# Patient Record
Sex: Male | Born: 2000
Health system: Southern US, Community
[De-identification: ages and names within clinical notes are randomized; demographics above are authoritative.]

## PROBLEM LIST (undated history)

## (undated) HISTORY — PX: ARTHOSCOPIC ROTAOR CUFF REPAIR: SHX5002

---
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
## 2020-04-27 ENCOUNTER — Other Ambulatory Visit: Payer: Self-pay

## 2020-04-27 DIAGNOSIS — Z20822 Contact with and (suspected) exposure to covid-19: Secondary | ICD-10-CM

## 2020-04-28 LAB — SARS-COV-2, NAA 2 DAY TAT

## 2020-04-28 LAB — NOVEL CORONAVIRUS, NAA: SARS-CoV-2, NAA: NOT DETECTED

## 2020-05-03 ENCOUNTER — Other Ambulatory Visit: Payer: Self-pay

## 2020-05-03 DIAGNOSIS — Z20822 Contact with and (suspected) exposure to covid-19: Secondary | ICD-10-CM

## 2020-05-04 LAB — NOVEL CORONAVIRUS, NAA: SARS-CoV-2, NAA: NOT DETECTED

## 2020-05-04 LAB — SARS-COV-2, NAA 2 DAY TAT

## 2020-05-09 ENCOUNTER — Other Ambulatory Visit: Payer: Self-pay

## 2020-05-09 DIAGNOSIS — Z20822 Contact with and (suspected) exposure to covid-19: Secondary | ICD-10-CM

## 2020-05-10 LAB — SARS-COV-2, NAA 2 DAY TAT

## 2020-05-10 LAB — NOVEL CORONAVIRUS, NAA: SARS-CoV-2, NAA: NOT DETECTED

## 2020-05-30 ENCOUNTER — Other Ambulatory Visit: Payer: Self-pay

## 2020-05-30 DIAGNOSIS — Z20822 Contact with and (suspected) exposure to covid-19: Secondary | ICD-10-CM

## 2020-05-31 LAB — SARS-COV-2, NAA 2 DAY TAT

## 2020-05-31 LAB — NOVEL CORONAVIRUS, NAA: SARS-CoV-2, NAA: NOT DETECTED

## 2020-06-06 ENCOUNTER — Other Ambulatory Visit: Payer: Self-pay

## 2020-06-06 DIAGNOSIS — Z20822 Contact with and (suspected) exposure to covid-19: Secondary | ICD-10-CM

## 2020-06-07 LAB — NOVEL CORONAVIRUS, NAA: SARS-CoV-2, NAA: NOT DETECTED

## 2020-06-07 LAB — SARS-COV-2, NAA 2 DAY TAT

## 2020-06-13 ENCOUNTER — Other Ambulatory Visit: Payer: Self-pay

## 2020-06-13 DIAGNOSIS — Z20822 Contact with and (suspected) exposure to covid-19: Secondary | ICD-10-CM

## 2020-06-14 LAB — NOVEL CORONAVIRUS, NAA: SARS-CoV-2, NAA: NOT DETECTED

## 2020-06-14 LAB — SARS-COV-2, NAA 2 DAY TAT

## 2020-11-12 ENCOUNTER — Telehealth: Payer: Self-pay

## 2020-11-13 NOTE — Telephone Encounter (Signed)
Mom called wanting immunization history and to make sure the pt is up to date. Pt gave verbal consent on the phone to speak with mother.  Pt needs a booster for [facility] and needs both men b vaccinations. Informed mom that pt can receive the [facility] and first men b on the same day.  Mom would like to schedule nurse visit for this.   Per office protocol we cannot give immunizations in a nurse visit. Pt is required to come in for an office visit with the provider.  Pt has not been seen in the office since 2019. He is home for Christmas Break currently.  Offered appts with Mary-Margaret in Jan but due to pts school and work schedule he could not come. Mom requested Jan 4th before pt goes back to school. Suggested for her to call the office for cancellations. Informed mom that the pts school may also be able to give him the vaccines and they will check on that.

## 2021-05-21 NOTE — Patient Instructions (Addendum)
DUE TO COVID-19 ONLY ONE VISITOR IS ALLOWED TO COME WITH YOU AND STAY IN THE WAITING ROOM ONLY DURING PRE OP AND PROCEDURE DAY OF SURGERY. THE 1 VISITOR  MAY VISIT WITH YOU AFTER SURGERY IN YOUR PRIVATE ROOM DURING VISITING HOURS ONLY!                 Aaron Sutton     Your procedure is scheduled on: 05/23/21   Report to [facility] Main  Entrance   Report to admitting at  8:30 AM     Call this number if you have problems the morning of surgery [phone]    BRUSH YOUR TEETH MORNING OF SURGERY AND RINSE YOUR MOUTH OUT, NO CHEWING GUM CANDY OR MINTS.   No food after midnight.    You may have clear liquid until 7:30 AM.    At 7:00 AM drink pre surgery drink.   Nothing by mouth after 7:30 AM.   CLEAR LIQUID DIET   Foods Allowed                                                                     Foods Excluded  Coffee and tea, regular and decaf                             liquids that you cannot  Plain Jell-O any favor except red or purple                                           see through such as: Fruit ices (not with fruit pulp)                                     milk, soups, orange juice  Iced Popsicles                                    All solid food Carbonated beverages, regular and diet                                    Cranberry, grape and apple juices Sports drinks like Gatorade Lightly seasoned clear broth or consume(fat free) Sugar, honey syrup  Sample Menu Breakfast                                Lunch                                     Supper Cranberry juice                    Beef broth  Chicken broth Jell-O                                     Grape juice                           Apple juice Coffee or tea                        Jell-O                                      Popsicle                                                Coffee or tea                        Coffee or  tea  _____________________________________________________________________    Take these medicines the morning of surgery with A SIP OF WATER: none                                You may not have any metal on your body including              piercings  Do not wear jewelry, lotions, powders or deodorant         .              Men may shave face and neck.   Do not bring valuables to the hospital. [facility] IS NOT             RESPONSIBLE   FOR VALUABLES.  Contacts, dentures or bridgework may not be worn into surgery.       Patients discharged the day of surgery will not be allowed to drive home.   IF YOU ARE HAVING SURGERY AND GOING HOME THE SAME DAY, YOU MUST HAVE AN ADULT TO DRIVE YOU HOME AND BE WITH YOU FOR 24 HOURS.   YOU MAY GO HOME BY TAXI OR UBER OR ORTHERWISE, BUT AN ADULT MUST ACCOMPANY YOU HOME AND STAY WITH YOU FOR 24 HOURS.  Name and phone number of your driver:  Special Instructions: N/A              Please read over the following fact sheets you were given: _____________________________________________________________________             [facility] - Preparing for Surgery Before surgery, you can play an important role.  Because skin is not sterile, your skin needs to be as free of germs as possible.  You can reduce the number of germs on your skin by washing with CHG (chlorahexidine gluconate) soap before surgery.  CHG is an antiseptic cleaner which kills germs and bonds with the skin to continue killing germs even after washing. Please DO NOT use if you have an allergy to CHG or antibacterial soaps.  If your skin becomes reddened/irritated stop using the CHG and inform your nurse when you arrive at Short Stay.  You may shave your face/neck.  Please follow these  instructions carefully:  1.  Shower with CHG Soap the night before surgery and the  morning of Surgery.  2.  If you choose to wash your hair, wash your hair first as usual with your  normal  shampoo.  3.   After you shampoo, rinse your hair and body thoroughly to remove the  shampoo.                                        4.  Use CHG as you would any other liquid soap.  You can apply chg directly  to the skin and wash                       Gently with a scrungie or clean washcloth.  5.  Apply the CHG Soap to your body ONLY FROM THE NECK DOWN.   Do not use on face/ open                           Wound or open sores. Avoid contact with eyes, ears mouth and genitals (private parts).                       Wash face,  Genitals (private parts) with your normal soap.             6.  Wash thoroughly, paying special attention to the area where your surgery  will be performed.  7.  Thoroughly rinse your body with warm water from the neck down.  8.  DO NOT shower/wash with your normal soap after using and rinsing off  the CHG Soap.             9.  Pat yourself dry with a clean towel.            10.  Wear clean pajamas.            11.  Place clean sheets on your bed the night of your first shower and do not  sleep with pets. Day of Surgery : Do not apply any lotions/deodorants the morning of surgery.  Please wear clean clothes to the hospital/surgery center.  FAILURE TO FOLLOW THESE INSTRUCTIONS MAY RESULT IN THE CANCELLATION OF YOUR SURGERY PATIENT SIGNATURE_________________________________  NURSE SIGNATURE__________________________________  ________________________________________________________________________   [name]  An incentive spirometer is a tool that can help keep your lungs clear and active. This tool measures how well you are filling your lungs with each breath. Taking long deep breaths may help reverse or decrease the chance of developing breathing (pulmonary) problems (especially infection) following: A long period of time when you are unable to move or be active. BEFORE THE PROCEDURE  If the spirometer includes an indicator to show your best effort, your nurse or  respiratory therapist will set it to a desired goal. If possible, sit up straight or lean slightly forward. Try not to slouch. Hold the incentive spirometer in an upright position. INSTRUCTIONS FOR USE  Sit on the edge of your bed if possible, or sit up as far as you can in bed or on a chair. Hold the incentive spirometer in an upright position. Breathe out normally. Place the mouthpiece in your mouth and seal your lips tightly around it. Breathe in slowly and as deeply as possible, raising the piston or the ball toward  the top of the column. Hold your breath for 3-5 seconds or for as long as possible. Allow the piston or ball to fall to the bottom of the column. Remove the mouthpiece from your mouth and breathe out normally. Rest for a few seconds and repeat Steps 1 through 7 at least 10 times every 1-2 hours when you are awake. Take your time and take a few normal breaths between deep breaths. The spirometer may include an indicator to show your best effort. Use the indicator as a goal to work toward during each repetition. After each set of 10 deep breaths, practice coughing to be sure your lungs are clear. If you have an incision (the cut made at the time of surgery), support your incision when coughing by placing a pillow or rolled up towels firmly against it. Once you are able to get out of bed, walk around indoors and cough well. You may stop using the incentive spirometer when instructed by your caregiver.  RISKS AND COMPLICATIONS Take your time so you do not get dizzy or light-headed. If you are in pain, you may need to take or ask for pain medication before doing incentive spirometry. It is harder to take a deep breath if you are having pain. AFTER USE Rest and breathe slowly and easily. It can be helpful to keep track of a log of your progress. Your caregiver can provide you with a simple table to help with this. If you are using the spirometer at home, follow these  instructions: SEEK MEDICAL CARE IF:  You are having difficultly using the spirometer. You have trouble using the spirometer as often as instructed. Your pain medication is not giving enough relief while using the spirometer. You develop fever of 100.5 F (38.1 C) or higher. SEEK IMMEDIATE MEDICAL CARE IF:  You cough up bloody sputum that had not been present before. You develop fever of 102 F (38.9 C) or greater. You develop worsening pain at or near the incision site. MAKE SURE YOU:  Understand these instructions. Will watch your condition. Will get help right away if you are not doing well or get worse. Document Released: 03/22/2007 Document Revised: 02/01/2012 Document Reviewed: 05/23/2007 [facility] Patient Information 2014 [employer] [place].   ________________________________________________________________________

## 2021-05-22 ENCOUNTER — Encounter (HOSPITAL_COMMUNITY): Payer: Self-pay

## 2021-05-22 ENCOUNTER — Other Ambulatory Visit: Payer: Self-pay

## 2021-05-22 ENCOUNTER — Encounter (HOSPITAL_COMMUNITY)
Admission: RE | Admit: 2021-05-22 | Discharge: 2021-05-22 | Disposition: A | Discharge status: Home / Self Care | Payer: 59 | Source: Ambulatory Visit | Attending: Orthopedic Surgery | Admitting: Orthopedic Surgery

## 2021-05-22 DIAGNOSIS — Z01812 Encounter for preprocedural laboratory examination: Secondary | ICD-10-CM | POA: Diagnosis present

## 2021-05-22 LAB — CBC
HCT: 38.2 % — ABNORMAL LOW (ref 39.0–52.0)
Hemoglobin: 12.8 g/dL — ABNORMAL LOW (ref 13.0–17.0)
MCH: 28.3 pg (ref 26.0–34.0)
MCHC: 33.5 g/dL (ref 30.0–36.0)
MCV: 84.5 fL (ref 80.0–100.0)
Platelets: 258 10*3/uL (ref 150–400)
RBC: 4.52 MIL/uL (ref 4.22–5.81)
RDW: 12.4 % (ref 11.5–15.5)
WBC: 5 10*3/uL (ref 4.0–10.5)
nRBC: 0 % (ref 0.0–0.2)

## 2021-05-22 NOTE — Progress Notes (Signed)
COVID Vaccine Completed: Yes Date COVID Vaccine completed: 04/2020 COVID vaccine manufacturer:    Moderna     PCP - Bennie Pierini: NP Cardiologist -   Chest x-ray -  EKG -  Stress Test -  ECHO -  Cardiac Cath -  Pacemaker/ICD device last checked:  Sleep Study -  CPAP -   Fasting Blood Sugar -  Checks Blood Sugar _____ times a day  Blood Thinner Instructions: Aspirin Instructions: Last Dose:  Anesthesia review:   Patient denies shortness of breath, fever, cough and chest pain at PAT appointment   Patient verbalized understanding of instructions that were given to them at the PAT appointment. Patient was also instructed that they will need to review over the PAT instructions again at home before surgery.

## 2021-05-23 ENCOUNTER — Ambulatory Visit (HOSPITAL_COMMUNITY): Payer: 59 | Admitting: Anesthesiology

## 2021-05-23 ENCOUNTER — Ambulatory Visit (HOSPITAL_COMMUNITY)
Admission: RE | Admit: 2021-05-23 | Discharge: 2021-05-23 | Disposition: A | Discharge status: Home / Self Care | Payer: 59 | Attending: Orthopedic Surgery | Admitting: Orthopedic Surgery

## 2021-05-23 ENCOUNTER — Encounter (HOSPITAL_COMMUNITY)
Admission: RE | Disposition: A | Discharge status: Home / Self Care | Payer: Self-pay | Source: Home / Self Care | Attending: Orthopedic Surgery

## 2021-05-23 ENCOUNTER — Ambulatory Visit (HOSPITAL_COMMUNITY): Payer: 59 | Admitting: Physician Assistant

## 2021-05-23 DIAGNOSIS — M25312 Other instability, left shoulder: Secondary | ICD-10-CM | POA: Insufficient documentation

## 2021-05-23 DIAGNOSIS — F17210 Nicotine dependence, cigarettes, uncomplicated: Secondary | ICD-10-CM | POA: Diagnosis not present

## 2021-05-23 HISTORY — PX: BANKART REPAIR: SHX5173

## 2021-05-23 SURGERY — REPAIR, SHOULDER, ARTHROSCOPIC, BANKART
Anesthesia: General | Site: Shoulder | Laterality: Left

## 2021-05-23 MED ORDER — MIDAZOLAM HCL 2 MG/2ML IJ SOLN
INTRAMUSCULAR | Status: AC
  Filled 2021-05-23: qty 2

## 2021-05-23 MED ORDER — FENTANYL CITRATE (PF) 100 MCG/2ML IJ SOLN: 25.0000 ug | INTRAMUSCULAR | Status: DC | PRN

## 2021-05-23 MED ORDER — LACTATED RINGERS IV SOLN: INTRAVENOUS | Status: DC

## 2021-05-23 MED ORDER — LIDOCAINE 2% (20 MG/ML) 5 ML SYRINGE
INTRAMUSCULAR | Status: DC | PRN
  Administered 2021-05-23: 20 mg via INTRAVENOUS

## 2021-05-23 MED ORDER — BUPIVACAINE LIPOSOME 1.3 % IJ SUSP
INTRAMUSCULAR | Status: DC | PRN
  Administered 2021-05-23: 10 mL via PERINEURAL

## 2021-05-23 MED ORDER — OXYCODONE HCL 5 MG/5ML PO SOLN: 5.0000 mg | Freq: Once | ORAL | Status: DC | PRN

## 2021-05-23 MED ORDER — CYCLOBENZAPRINE HCL 10 MG PO TABS: 10.0000 mg | ORAL_TABLET | Freq: Three times a day (TID) | ORAL | 1 refills | Status: AC | PRN

## 2021-05-23 MED ORDER — ACETAMINOPHEN 325 MG PO TABS: 325.0000 mg | ORAL_TABLET | ORAL | Status: DC | PRN

## 2021-05-23 MED ORDER — BUPIVACAINE-EPINEPHRINE (PF) 0.5% -1:200000 IJ SOLN
INTRAMUSCULAR | Status: DC | PRN
  Administered 2021-05-23: 10 mL via PERINEURAL

## 2021-05-23 MED ORDER — MEPERIDINE HCL 50 MG/ML IJ SOLN: 6.2500 mg | INTRAMUSCULAR | Status: DC | PRN

## 2021-05-23 MED ORDER — ONDANSETRON HCL 4 MG PO TABS: 4.0000 mg | ORAL_TABLET | Freq: Three times a day (TID) | ORAL | 0 refills | Status: AC | PRN

## 2021-05-23 MED ORDER — PROPOFOL 10 MG/ML IV BOLUS
INTRAVENOUS | Status: DC | PRN
  Administered 2021-05-23: 200 mg via INTRAVENOUS
  Administered 2021-05-23: 100 mg via INTRAVENOUS

## 2021-05-23 MED ORDER — PROPOFOL 10 MG/ML IV BOLUS
INTRAVENOUS | Status: AC
  Filled 2021-05-23: qty 20

## 2021-05-23 MED ORDER — SODIUM CHLORIDE 0.9 % IR SOLN
Status: DC | PRN
  Administered 2021-05-23 (×2): 6000 mL

## 2021-05-23 MED ORDER — CHLORHEXIDINE GLUCONATE 0.12 % MT SOLN
15.0000 mL | Freq: Once | OROMUCOSAL | Status: AC
Start: 2021-05-23 — End: 2021-05-23

## 2021-05-23 MED ORDER — ACETAMINOPHEN 160 MG/5ML PO SOLN: 325.0000 mg | ORAL | Status: DC | PRN

## 2021-05-23 MED ORDER — ROCURONIUM BROMIDE 10 MG/ML (PF) SYRINGE
PREFILLED_SYRINGE | INTRAVENOUS | Status: DC | PRN
  Administered 2021-05-23: 60 mg via INTRAVENOUS

## 2021-05-23 MED ORDER — ONDANSETRON HCL 4 MG/2ML IJ SOLN
INTRAMUSCULAR | Status: DC | PRN
  Administered 2021-05-23: 4 mg via INTRAVENOUS

## 2021-05-23 MED ORDER — DEXMEDETOMIDINE (PRECEDEX) IN NS 20 MCG/5ML (4 MCG/ML) IV SYRINGE
PREFILLED_SYRINGE | INTRAVENOUS | Status: DC | PRN
  Administered 2021-05-23 (×2): 10 ug via INTRAVENOUS

## 2021-05-23 MED ORDER — DEXAMETHASONE SODIUM PHOSPHATE 10 MG/ML IJ SOLN
INTRAMUSCULAR | Status: DC | PRN
  Administered 2021-05-23: 10 mg via INTRAVENOUS

## 2021-05-23 MED ORDER — NAPROXEN 500 MG PO TABS: 500.0000 mg | ORAL_TABLET | Freq: Two times a day (BID) | ORAL | 1 refills | Status: AC

## 2021-05-23 MED ORDER — PHENYLEPHRINE 40 MCG/ML (10ML) SYRINGE FOR IV PUSH (FOR BLOOD PRESSURE SUPPORT)
PREFILLED_SYRINGE | INTRAVENOUS | Status: DC | PRN
  Administered 2021-05-23: 120 ug via INTRAVENOUS

## 2021-05-23 MED ORDER — OXYCODONE-ACETAMINOPHEN 5-325 MG PO TABS: 1.0000 | ORAL_TABLET | ORAL | 0 refills | Status: AC | PRN

## 2021-05-23 MED ORDER — SUGAMMADEX SODIUM 200 MG/2ML IV SOLN
INTRAVENOUS | Status: DC | PRN
  Administered 2021-05-23: 200 mg via INTRAVENOUS

## 2021-05-23 MED ORDER — FENTANYL CITRATE (PF) 250 MCG/5ML IJ SOLN
INTRAMUSCULAR | Status: AC
  Filled 2021-05-23: qty 5

## 2021-05-23 MED ORDER — CEFAZOLIN SODIUM-DEXTROSE 2-4 GM/100ML-% IV SOLN
2.0000 g | INTRAVENOUS | Status: AC
Start: 2021-05-23 — End: 2021-05-23
  Administered 2021-05-23: 2 g via INTRAVENOUS
  Filled 2021-05-23: qty 100

## 2021-05-23 MED ORDER — OXYCODONE HCL 5 MG PO TABS
5.0000 mg | ORAL_TABLET | Freq: Once | ORAL | Status: DC | PRN
Start: 2021-05-23 — End: 2021-05-23

## 2021-05-23 MED ORDER — FENTANYL CITRATE (PF) 100 MCG/2ML IJ SOLN
INTRAMUSCULAR | Status: DC | PRN
  Administered 2021-05-23: 25 ug via INTRAVENOUS
  Administered 2021-05-23: 75 ug via INTRAVENOUS
  Administered 2021-05-23: 50 ug via INTRAVENOUS

## 2021-05-23 MED ORDER — FENTANYL CITRATE (PF) 100 MCG/2ML IJ SOLN
50.0000 ug | INTRAMUSCULAR | Status: DC
  Administered 2021-05-23: 100 ug via INTRAVENOUS
  Filled 2021-05-23: qty 2

## 2021-05-23 MED ORDER — MIDAZOLAM HCL 5 MG/5ML IJ SOLN
INTRAMUSCULAR | Status: DC | PRN
  Administered 2021-05-23: 2 mg via INTRAVENOUS

## 2021-05-23 MED ORDER — ONDANSETRON HCL 4 MG/2ML IJ SOLN: 4.0000 mg | Freq: Once | INTRAMUSCULAR | Status: DC | PRN

## 2021-05-23 MED ORDER — MIDAZOLAM HCL 2 MG/2ML IJ SOLN
1.0000 mg | INTRAMUSCULAR | Status: DC
  Administered 2021-05-23: 2 mg via INTRAVENOUS
  Filled 2021-05-23: qty 2

## 2021-05-23 MED ORDER — ORAL CARE MOUTH RINSE
15.0000 mL | Freq: Once | OROMUCOSAL | Status: AC
  Administered 2021-05-23: 15 mL via OROMUCOSAL

## 2021-05-23 SURGICAL SUPPLY — 59 items
ANCHOR SUT 1.8 FBRTK KNTLS 2SU (Anchor) ×8 IMPLANT
ANCHOR SUT FBRTK 2 WIRE (Anchor) ×8 IMPLANT
BIT DRILL FLEX 1.7 SHR FBRTK (DRILL) ×1 IMPLANT
BLADE SURG 11 STRL SS (BLADE) ×2 IMPLANT
BOOTCOVER CLEANROOM LRG (PROTECTIVE WEAR) ×4 IMPLANT
CANISTER SUCT LVC 12 LTR MEDI- (MISCELLANEOUS) ×2 IMPLANT
CANNULA ACUFLEX KIT 5X76 (CANNULA) ×4 IMPLANT
CANNULA DRILOCK 5.0X75 (CANNULA) ×10 IMPLANT
CANNULA TWIST IN 8.25X7CM (CANNULA) ×2 IMPLANT
COVER WAND RF STERILE (DRAPES) ×2 IMPLANT
DISSECTOR  3.8MM X 13CM (MISCELLANEOUS) ×2
DISSECTOR 3.8MM X 13CM (MISCELLANEOUS) ×2 IMPLANT
DRAPE INCISE IOBAN 66X45 STRL (DRAPES) ×2 IMPLANT
DRAPE ORTHO SPLIT 77X108 STRL (DRAPES) ×2
DRAPE STERI 35X30 U-POUCH (DRAPES) IMPLANT
DRAPE SURG 17X11 SM STRL (DRAPES) ×2 IMPLANT
DRAPE SURG ORHT 6 SPLT 77X108 (DRAPES) ×2 IMPLANT
DRAPE U-SHAPE 47X51 STRL (DRAPES) IMPLANT
DRILL FLEX 1.7 SHARP OBT FBRTK (DRILL) ×2
DRSG PAD ABDOMINAL 8X10 ST (GAUZE/BANDAGES/DRESSINGS) ×2 IMPLANT
DURAPREP 26ML APPLICATOR (WOUND CARE) ×4 IMPLANT
GAUZE SPONGE 4X4 12PLY STRL (GAUZE/BANDAGES/DRESSINGS) ×2 IMPLANT
GLOVE SURG ENC MOIS LTX SZ7.5 (GLOVE) ×2 IMPLANT
GLOVE SURG ENC MOIS LTX SZ8 (GLOVE) ×2 IMPLANT
GLOVE SURG MICRO LTX SZ7.5 (GLOVE) ×2 IMPLANT
GOWN STRL REUS W/ TWL LRG LVL3 (GOWN DISPOSABLE) ×1 IMPLANT
GOWN STRL REUS W/ TWL XL LVL3 (GOWN DISPOSABLE) ×2 IMPLANT
GOWN STRL REUS W/TWL LRG LVL3 (GOWN DISPOSABLE) ×1
GOWN STRL REUS W/TWL XL LVL3 (GOWN DISPOSABLE) ×2
KIT BASIN OR (CUSTOM PROCEDURE TRAY) ×2 IMPLANT
KIT CVD SPEAR FBRTK 1.8 DRILL (KITS) ×2 IMPLANT
KIT STR SPEAR 1.8 FBRTK DISP (KITS) ×2 IMPLANT
KIT TURNOVER KIT B (KITS) ×2 IMPLANT
LASSO 45 DEG CVD LEFT (SUTURE) ×2 IMPLANT
MANIFOLD NEPTUNE II (INSTRUMENTS) ×2 IMPLANT
NDL SUT 6 .5 CRC .975X.05 MAYO (NEEDLE) IMPLANT
NEEDLE MAYO TAPER (NEEDLE)
NEEDLE SPNL 18GX3.5 QUINCKE PK (NEEDLE) ×2 IMPLANT
NS IRRIG 1000ML POUR BTL (IV SOLUTION) ×2 IMPLANT
PACK SHOULDER (CUSTOM PROCEDURE TRAY) ×2 IMPLANT
PAD ARMBOARD 7.5X6 YLW CONV (MISCELLANEOUS) ×4 IMPLANT
SLEEVE ARM SUSPENSION SYSTEM (MISCELLANEOUS) ×2 IMPLANT
SLING ARM FOAM STRAP LRG (SOFTGOODS) IMPLANT
SLING ARM FOAM STRAP MED (SOFTGOODS) IMPLANT
SLING ARM IMMOBILIZER LRG (SOFTGOODS) ×2 IMPLANT
SLING S3 LATERAL DISP (MISCELLANEOUS) ×2 IMPLANT
SPONGE LAP 4X18 RFD (DISPOSABLE) IMPLANT
STRIP CLOSURE SKIN 1/2X4 (GAUZE/BANDAGES/DRESSINGS) ×2 IMPLANT
SUT LASSO 45 DEG R (SUTURE) IMPLANT
SUT MNCRL AB 3-0 PS2 18 (SUTURE) ×2 IMPLANT
SUT MNCRL AB 4-0 PS2 18 (SUTURE) IMPLANT
SUT PDS AB 1 CT  36 (SUTURE) ×3
SUT PDS AB 1 CT 36 (SUTURE) ×3 IMPLANT
SYR 20ML LL LF (SYRINGE) IMPLANT
TAPE PAPER 3X10 WHT MICROPORE (GAUZE/BANDAGES/DRESSINGS) ×2 IMPLANT
TOWEL OR 17X26 10 PK STRL BLUE (TOWEL DISPOSABLE) ×2 IMPLANT
TOWEL OR NON WOVEN STRL DISP B (DISPOSABLE) ×2 IMPLANT
TUBING ARTHROSCOPY IRRIG 16FT (MISCELLANEOUS) ×2 IMPLANT
WATER STERILE IRR 1000ML POUR (IV SOLUTION) ×2 IMPLANT

## 2021-05-23 NOTE — Anesthesia Procedure Notes (Signed)
Anesthesia Regional Block: Interscalene brachial plexus block   Pre-Anesthetic Checklist: , timeout performed,  Correct Patient, Correct Site, Correct Laterality,  Correct Procedure, Correct Position, site marked,  Risks and benefits discussed,  Surgical consent,  Pre-op evaluation,  At surgeon's request and post-op pain management  Laterality: Left  Prep: chloraprep       Needles:  Injection technique: Single-shot  Needle Type: Echogenic Stimulator Needle     Needle Length: 5cm  Needle Gauge: 22     Additional Needles:   Procedures:, nerve stimulator,,, ultrasound used (permanent image in chart),,     Nerve Stimulator or Paresthesia:  Response: hand, 0.45 mA  Additional Responses:   Narrative:  Start time: 05/23/2021 9:26 AM End time: 05/23/2021 9:32 AM Injection made incrementally with aspirations every 5 mL.  Performed by: Personally  Anesthesiologist: Bethena Midget, MD  Additional Notes: Functioning IV was confirmed and monitors were applied.  A 24mm 22ga Arrow echogenic stimulator needle was used. Sterile prep and drape,hand hygiene and sterile gloves were used. Ultrasound guidance: relevant anatomy identified, needle position confirmed, local anesthetic spread visualized around nerve(s)., vascular puncture avoided.  Image printed for medical record. Negative aspiration and negative test dose prior to incremental administration of local anesthetic. The patient tolerated the procedure well.

## 2021-05-23 NOTE — Discharge Instructions (Addendum)
   Vania Rea. Supple, M.D., F.A.A.O.S. Orthopaedic Surgery Specializing in Arthroscopic and Reconstructive Surgery of the Shoulder [phone] [address] [place], [place] [address] - Fax [phone]  POST-OP SHOULDER ARTHROSCOPIC ROTATOR CUFF AND/OR LABRAL REPAIR INSTRUCTIONS  1. Call the office at [phone] to schedule your first post-op appointment 7-10 days from the date of your surgery.  2. Leave the steri-strips in place over your incisions when performing dressing changes and showering. You may remove your dressings and begin showering 72 hours from surgery. You can expect drainage that is clear to bloody in nature that occasionally will soak through your dressings. If this occurs go ahead and perform a dressing change. The drainage should lessen daily and when there is no drainage from your incisions feel free to go without a dressing.  3. Wear your sling/immobilizer at all times except  to occasionally let your arm dangle by your side to stretch your elbow. You also need to sleep in your sling immobilizer until instructed otherwise.  4. Range of motion to your elbow, wrist, and hand are encouraged 3-5 times daily. Exercise to your hand and fingers helps to reduce swelling you may experience.  5. Utilize ice to the shoulder 3-4 times minimum a day and additionally if you are experiencing pain.  6. You may one-armed drive when safely off of narcotics and muscle relaxants. You may use your hand that is in the sling to support the steering wheel only. However, should it be your right arm that is in the sling it is not to be used for gear shifting in a manual transmission.  7. Pain control following an exparel block  To help control your post-operative pain you received a nerve block  performed with Exparel which is a long acting anesthetic (numbing agent) which can provide pain relief and sensations of numbness (and relief of pain) in the operative shoulder and arm for  up to 3 days. Sometimes it provides mixed relief, meaning you may still have numbness in certain areas of the arm but can still be able to move  parts of that arm, hand, and fingers. We recommend that your prescribed pain medications  be used as needed. We do not feel it is necessary to "pre medicate" and "stay ahead" of pain.  Taking narcotic pain medications when you are not having any pain can lead to unnecessary and potentially dangerous side effects.    8. Pain medications can produce constipation along with their use. If you experience this, the use of an over the counter stool softener or laxative daily is recommended.   9. For additional questions or concerns, please do not hesitate to call the office. If after hours there is an answering service to forward your concerns to the physician on call.   Use the ice machine as much as possible in the first 5-7 days from surgery, then you can wean its use to as needed.    POST-OP EXERCISES   NO EXERCISES TO SHOULDER, MAY ALLOW ARM TO DANGLE BY SIDE FOR HYGIENE AND MOVE ELBOW WRIST AND HAND.

## 2021-05-23 NOTE — Progress Notes (Signed)
Assisted Dr. Oddono with left, ultrasound guided, interscalene  block. Side rails up, monitors on throughout procedure. See vital signs in flow sheet. Tolerated Procedure well. 

## 2021-05-23 NOTE — Anesthesia Procedure Notes (Signed)
Procedure Name: Intubation Date/Time: 05/23/2021 10:34 AM Performed by: Cleda Daub, CRNA Pre-anesthesia Checklist: Patient identified, Emergency Drugs available, Suction available and Patient being monitored Patient Re-evaluated:Patient Re-evaluated prior to induction Oxygen Delivery Method: Circle system utilized Preoxygenation: Pre-oxygenation with 100% oxygen Induction Type: IV induction Ventilation: Mask ventilation without difficulty Laryngoscope Size: Mac and 4 Grade View: Grade I Tube type: Oral Tube size: 7.5 mm Number of attempts: 1 Airway Equipment and Method: Stylet and Oral airway Placement Confirmation: ETT inserted through vocal cords under direct vision, positive ETCO2 and breath sounds checked- equal and bilateral Secured at: 23 cm Tube secured with: Tape Dental Injury: Teeth and Oropharynx as per pre-operative assessment

## 2021-05-23 NOTE — Anesthesia Postprocedure Evaluation (Signed)
Anesthesia Post Note  Patient: Aaron Sutton  Procedure(s) Performed: Left shoulder arthroscopy, Bankart repair, remplissage (Left: Hip)     Patient location during evaluation: PACU Anesthesia Type: General Level of consciousness: awake and alert Pain management: pain level controlled Vital Signs Assessment: post-procedure vital signs reviewed and stable Respiratory status: spontaneous breathing, nonlabored ventilation, respiratory function stable and patient connected to nasal cannula oxygen Cardiovascular status: blood pressure returned to baseline and stable Postop Assessment: no apparent nausea or vomiting Anesthetic complications: no   No notable events documented.  Last Vitals:  Vitals:   05/23/21 1345 05/23/21 1407  BP: 115/68 117/71  Pulse: 74 60  Resp: 12 16  Temp: (!) 36.4 C 36.5 C  SpO2: 100% 99%    Last Pain:  Vitals:   05/23/21 1407  TempSrc:   PainSc: 0-No pain                 Damian Hofstra

## 2021-05-23 NOTE — Transfer of Care (Signed)
Immediate Anesthesia Transfer of Care Note  Patient: Aaron Sutton  Procedure(s) Performed: Left shoulder arthroscopy, Bankart repair, remplissage (Left: Hip)  Patient Location: PACU  Anesthesia Type:GA combined with regional for post-op pain  Level of Consciousness: awake, alert , oriented and patient cooperative  Airway & Oxygen Therapy: Patient Spontanous Breathing and Patient connected to face mask oxygen  Post-op Assessment: Report given to RN and Post -op Vital signs reviewed and stable  Post vital signs: Reviewed and stable  Last Vitals:  Vitals Value Taken Time  BP 112/71 05/23/21 1300  Temp    Pulse 73 05/23/21 1305  Resp 17 05/23/21 1305  SpO2 98 % 05/23/21 1305  Vitals shown include unvalidated device data.  Last Pain:  Vitals:   05/23/21 0840  TempSrc: Oral         Complications: No notable events documented.

## 2021-05-23 NOTE — Op Note (Signed)
05/23/2021  12:44 PM  PATIENT:   Aaron Sutton  20 y.o. male  PRE-OPERATIVE DIAGNOSIS:  Left shoulder recurrent anterior instability  POST-OPERATIVE DIAGNOSIS: Same  PROCEDURE:  1.  Left shoulder examination under anesthesia  2.  Left shoulder arthroscopic Bankart repair  3.  Left shoulder arthroscopic remplissage  SURGEON:  Brason Berthelot, Vania Rea. M.D.  ASSISTANTS: Ralene Bathe, PA-C  ANESTHESIA:   General endotracheal and interscalene block with Exparel  EBL: Minimal  SPECIMEN: None  Drains: None   PATIENT DISPOSITION:  PACU - hemodynamically stable.    PLAN OF CARE: Discharge to home after PACU  Brief history:  Aaron Sutton is a 20 year old male with a long history of recurrent left shoulder anterior instability.  His instability episodes are now happening multiple times a week.  His plain radiographs as well as MRI scan confirms significant bipolar bone loss and changes consistent with chronic anterior instability.  Due to his increasing functional imitations he is brought to the operating this time for planned arthroscopic stabilization as described below.  Preoperatively counseled Aaron Sutton regarding treatment options as well as the potential risks versus benefits thereof.  Possible surgical complications were reviewed including bleeding, infection, neurovascular injury, persistence of pain, loss of motion, anesthetic complication, recurrence of instability, and possible need for additional surgery.  He understands, and accepts, and agrees with the plan procedure.  Procedure in detail:  After undergoing routine preop evaluation patient received prophylactic antibiotics and interscalene block with Exparel was established in the holding area by the anesthesia department.  Patient was subsequently placed supine on the operating table underwent the smooth induction of general endotracheal anesthesia.  Turned to the right lateral decubitus position on a beanbag and appropriately padded and  protected.  Left shoulder examination under anesthesia revealed marked anterior instability with 2+ sulcus sign and the shoulder readily sitting in a dislocated position.  This point the left shoulder girdle region was sterilely prepped and draped in standard fashion and was subsequently suspended with longitudinal and lateral traction utilizing the Arthrex suspension device.  Timeout was called.  A posterior portal was established at the glenohumeral joint and anterior portal established under direct visualization.  Large hemarthrosis was evacuated.  Extensive synovitis noted and a limited synovectomy was performed.  The majority of the articular surfaces were in good condition but we did indeed find a very large Hill-Sachs defect almost 3 cm length and 2 cm in width.  Additionally there was some bony deficiency of the anterior inferior quadrant of the glenoid as we had noted on the preop MRI scan.  I would estimate that this Close to 15 to 20% of the glenoid articular surface.  The rotator cuff was noted to be intact.  The biceps tendon was of normal caliber with no evidence of proximal or distal instability and the superior labrum was unremarkable.  This point we worked to mobilize the anterior inferior capsular tissues and the anterior band of the inferior glenohumeral ligament which was scarred fied significantly medial on the scapular neck.  This was carefully mobilized and freed up down past the 6:00 to the 5:30 position and ultimately were able to mobilize a large sleeve of tissue which did also have a significant amount of bone with it from the previous bony Bankart.  We then abraded the anterior inferior margin of the glenoid using a ball rasp creating a bony bed for repair.  An accessory anterior inferior portal was then established through the upper subscapularis and we placed a series of  4 Arthrex all suture suture anchors equidistant across the width of the anterior inferior quadrant of the glenoid at  the osteoarticular margin.  All of the suture limbs were then shuttled through the anterior inferior capsular tissues in a horizontal mattress pattern and then subsequently tied with sliding/locking knots followed by multiple overhand throws and alternating posts.  Additionally were able to capture the bone fragment and reapproximate this on the anterior margin of the glenoid as well.  Upon completion was very pleased with the overall construct and recreation of anterior capsule labral tissues and complete illumination of the redundant capsular volume.  At this point we then turned our attention posteriorly where we cleaned and subsequently abraded the bony bed of the Hill-Sachs defect.  This was exceptionally large and we elected to use a total of 4 knotless suture anchors giving [place] to sites of repair for this relatively large defect.  Once the bony bed had been cleaned we then established to portals into the subacromial space which we had identified by placing a spinal needle.  Through these cannulas we then used a percutaneous technique to place a total of 4 knotless suture anchors spaced equidistant across the width of the Hill-Sachs defect.  Once these suture anchors had been passed and the suture limbs were cleared they were then shuttled through 1 another to allow a knotless horizontal mattress repair bringing tissues of the infraspinatus and posterior capsule into the defect of the Hill-Sachs lesion nicely filling defect allowing excellent tensioning and complete elimination of the bony defect.  Once the limbs were all appropriately tensioned they were clipped.  Final inspection showed the humeral head to be symmetrically aligned over the glenoid with good soft tissue balance.  At this point all fluid and incensed were then removed.  The portals were closed with Monocryl and a Steri-Strip.  A dry dressing was then taped about the left shoulder left arm was placed into a sling immobilizer and the patient was  awakened, extubated, and taken to the recovery room in stable condition.  Ralene Bathe, PA-C was utilized as an Geophysicist/field seismologist throughout this case, essential for help with positioning the patient, positioning extremity, tissue manipulation, implantation of the prosthesis, suture management, wound closure, and intraoperative decision-making.  Senaida Lange MD   Contact # [phone]

## 2021-05-23 NOTE — H&P (Signed)
Aaron Sutton    Chief Complaint: Left shoulder recurrent anterior instability HPI: The patient is a 20 y.o. male with chronic recurrent left shoulder instability which is now happening multiple times a week.  He is being brought to the operating room at this time for planned left shoulder arthroscopic reconstruction and stabilization.  No past medical history on file.  Past Surgical History:  Procedure Laterality Date   ARTHOSCOPIC ROTAOR CUFF REPAIR Right     No family history on file.  Social History:  reports that he has been smoking cigarettes. He has never used smokeless tobacco. He reports current alcohol use. He reports current drug use. Drug: Marijuana.   No medications prior to admission.     Physical Exam: Patient is a slender, well-developed, healthy-appearing young male in no acute distress.  Left shoulder examination is as recently documented in our office which includes a markedly positive anterior apprehension sign.  He has overall good strength to manual muscle testing.  Plain radiographs show the shoulder to be congruently aligned.  There is evidence for a bony Bankart lesion.  Recent left shoulder MRI scan confirms bipolar bone loss with deficiency of the anterior inferior glenoid as well as a large Hill-Sachs defect consistent with chronic recurrent left shoulder anterior instability.  Vitals  Temp:  [97.9 F (36.6 C)-98.6 F (37 C)] 98.6 F (37 C) (07/01 0840) Pulse Rate:  [67-75] 75 (07/01 0840) Resp:  [15] 15 (07/01 0840) BP: (112-133)/(61-80) 133/80 (07/01 0840) SpO2:  [100 %] 100 % (07/01 0840) Weight:  [59.4 kg] 59.4 kg (06/30 1414)  Assessment/Plan  Impression: Left shoulder recurrent anterior instability  Plan of Action: Procedure(s): Left shoulder arthroscopy, Bankart repair, remplissage  Nihaal Friesen M Leshawn Houseworth 05/23/2021, 9:17 AM Contact # [phone]

## 2021-05-23 NOTE — Anesthesia Preprocedure Evaluation (Signed)
Anesthesia Evaluation  Patient identified by MRN, date of birth, ID band Patient awake    Reviewed: Allergy & Precautions, H&P , NPO status , Patient's Chart, lab work & pertinent test results, reviewed documented beta blocker date and time   Airway Mallampati: II  TM Distance: >3 FB Neck ROM: full    Dental no notable dental hx.    Pulmonary neg pulmonary ROS, Current Smoker,    Pulmonary exam normal breath sounds clear to auscultation       Cardiovascular Exercise Tolerance: Good negative cardio ROS   Rhythm:regular Rate:Normal     Neuro/Psych negative neurological ROS  negative psych ROS   GI/Hepatic negative GI ROS, Neg liver ROS,   Endo/Other  negative endocrine ROS  Renal/GU negative Renal ROS  negative genitourinary   Musculoskeletal   Abdominal   Peds  Hematology negative hematology ROS (+)   Anesthesia Other Findings   Reproductive/Obstetrics negative OB ROS                             Anesthesia Physical Anesthesia Plan  ASA: 2  Anesthesia Plan: General   Post-op Pain Management: GA combined w/ Regional for post-op pain   Induction: Intravenous  PONV Risk Score and Plan: 2 and Ondansetron and Midazolam  Airway Management Planned: Oral ETT  Additional Equipment: None  Intra-op Plan:   Post-operative Plan: Extubation in OR  Informed Consent: I have reviewed the patients History and Physical, chart, labs and discussed the procedure including the risks, benefits and alternatives for the proposed anesthesia with the patient or authorized representative who has indicated his/her understanding and acceptance.     Dental Advisory Given  Plan Discussed with: CRNA and Anesthesiologist  Anesthesia Plan Comments: (Discussed both nerve block for pain relief post-op and GA; including NV, sore throat, dental injury, and pulmonary complications)        Anesthesia  Quick Evaluation

## 2021-05-28 ENCOUNTER — Encounter (HOSPITAL_COMMUNITY): Payer: Self-pay | Admitting: Orthopedic Surgery
